# Patient Record
Sex: Male | Born: 2000
Health system: Southern US, Community
[De-identification: ages and names within clinical notes are randomized; demographics above are authoritative.]

## PROBLEM LIST (undated history)

## (undated) DIAGNOSIS — H6093 Unspecified otitis externa, bilateral: Secondary | ICD-10-CM

## (undated) DIAGNOSIS — S42309A Unspecified fracture of shaft of humerus, unspecified arm, initial encounter for closed fracture: Secondary | ICD-10-CM

## (undated) HISTORY — PX: TYMPANOSTOMY TUBE PLACEMENT: SHX32

## (undated) HISTORY — DX: Unspecified fracture of shaft of humerus, unspecified arm, initial encounter for closed fracture: S42.309A

## (undated) HISTORY — DX: Unspecified otitis externa, bilateral: H60.93

---
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
## 2019-01-30 ENCOUNTER — Telehealth: Payer: Self-pay | Admitting: Family Medicine

## 2019-01-30 NOTE — Telephone Encounter (Signed)
Please advise    Copied from CRM [id number]. Topic: General - Other >> Jan 30, 2019  3:31 PM Percival Spanish wrote:  Pt would also like to ger a CPE  Pt has a copy of his immunizations that his mom gave him and next to the meningitis 2019 it say invalid and he is asking why that would say that

## 2019-01-30 NOTE — Telephone Encounter (Signed)
Pt just had CPE 09/10/2018. Our records in Epic do not say anything and [facility] is not working at the moment for me to verify. I will keep trying and see if I can find anything in the system.

## 2019-01-31 ENCOUNTER — Telehealth: Payer: Self-pay | Admitting: Family Medicine

## 2019-01-31 NOTE — Telephone Encounter (Signed)
I have placed a immunization record form with a charge sheet in the bin up front.

## 2019-01-31 NOTE — Telephone Encounter (Signed)
Paperwork placed in PCP folder for signature.  

## 2019-01-31 NOTE — Telephone Encounter (Signed)
[facility] updated to show name brand of Bexsero. Pt guardian made aware. Also pt is going to health department next week to get 2nd vaccines.

## 2019-02-01 NOTE — Telephone Encounter (Signed)
Forms received.  Erica at front desk has taken them to call patient to let him know he can pick them up.

## 2019-02-01 NOTE — Telephone Encounter (Signed)
Form completed and placed in basket  

## 2019-02-06 DIAGNOSIS — Z23 Encounter for immunization: Secondary | ICD-10-CM | POA: Diagnosis not present

## 2019-03-12 ENCOUNTER — Ambulatory Visit: Payer: 59

## 2019-12-10 ENCOUNTER — Other Ambulatory Visit: Payer: Self-pay

## 2019-12-10 ENCOUNTER — Encounter: Payer: Self-pay | Admitting: Dermatology

## 2019-12-10 ENCOUNTER — Ambulatory Visit: Payer: 59 | Admitting: Dermatology

## 2019-12-10 DIAGNOSIS — L700 Acne vulgaris: Secondary | ICD-10-CM

## 2019-12-10 MED ORDER — ISOTRETINOIN 40 MG PO CAPS: 40.0000 mg | ORAL_CAPSULE | Freq: Every day | ORAL | 0 refills | Status: AC

## 2019-12-10 NOTE — Progress Notes (Signed)
   Follow-Up Visit   Subjective  Roberto Frazier is a 19 y.o. male who presents for the following: Acne (31 day isotretinoin f/u-see flowsheet).  acne Location: face>torso Duration: several years Quality: improved Associated Signs/Symptoms:deep bumps Modifying Factors: isotretinoin month four Severity:  Timing: Context:   The following portions of the chart were reviewed this encounter and updated as appropriate:     Objective  Well appearing patient in no apparent distress; mood and affect are within normal limits.  A focused examination was performed including face, head, neck. Relevant physical exam findings are noted in the Assessment and Plan.   Assessment & Plan  Acne vulgaris (3) Head - Anterior (Face)  Continue same dose. Discussed target dose of 150mg /kg rather than 120 but will decide in 2-3 months.

## 2020-01-14 ENCOUNTER — Ambulatory Visit: Payer: 59 | Admitting: Dermatology

## 2020-01-14 ENCOUNTER — Other Ambulatory Visit: Payer: Self-pay

## 2020-01-14 DIAGNOSIS — L700 Acne vulgaris: Secondary | ICD-10-CM

## 2020-01-14 DIAGNOSIS — L729 Follicular cyst of the skin and subcutaneous tissue, unspecified: Secondary | ICD-10-CM

## 2020-01-14 MED ORDER — MYORISAN 40 MG PO CAPS: 40.0000 mg | ORAL_CAPSULE | Freq: Every day | ORAL | 0 refills | Status: DC

## 2020-01-14 NOTE — Patient Instructions (Addendum)
Patient preferred to take a printed script to [facility].  Roberto Frazier acne is doing pretty much as expected his torso is virtually clear his face is about 75-85% improved with some persistent erythem;a he will continue the same dose of isotretinoin.  We also discussed elective removal of an epidermoid cyst on his left scapula.  He understands that this is a benign lesion that is removed if he chooses. he is actively weightlifting now with larger muscles than I have ever seen on him so removing the cyst would involve 2 weeks of no upper back weight activity to minimize the risk of the wound spreading.  I suggested he wait to do this until he is finished with the isotretinoin.

## 2020-01-20 ENCOUNTER — Encounter: Payer: Self-pay | Admitting: Dermatology

## 2020-01-20 NOTE — Progress Notes (Signed)
   Follow-Up Visit   Subjective  Roberto Frazier is a 19 y.o. male who presents for the following: Isotret follow-up (Here for 31 day isotretinoin follow-up.).  Acne Location: Face more than torso Duration: Several years Quality: Improved Associated Signs/Symptoms: Modifying Factors: Isotretinoin Severity:  Timing: Context:   The following portions of the chart were reviewed this encounter and updated as appropriate: Tobacco  Allergies  Meds  Problems  Med Hx  Surg Hx  Fam Hx      Objective  Well appearing patient in no apparent distress; mood and affect are within normal limits.  All skin waist up examined.   Assessment & Plan  Acne vulgaris Head - Anterior (Face)  Cyst of skin Left Upper Back  Patient may choose to schedule surgery after isotretinoin is complete

## 2020-02-17 ENCOUNTER — Telehealth: Payer: Self-pay

## 2020-02-17 ENCOUNTER — Ambulatory Visit: Payer: 59 | Admitting: Dermatology

## 2020-02-17 ENCOUNTER — Other Ambulatory Visit: Payer: Self-pay

## 2020-02-17 DIAGNOSIS — L700 Acne vulgaris: Secondary | ICD-10-CM | POA: Diagnosis not present

## 2020-02-17 MED ORDER — MYORISAN 40 MG PO CAPS: 40.0000 mg | ORAL_CAPSULE | Freq: Every day | ORAL | 0 refills | Status: DC

## 2020-02-17 NOTE — Telephone Encounter (Signed)
Phone call from patient stating he tried to get his Isotret prescription from last month filled and the Pharmacy wouldn't let him fill it.  Patient informed that he can't get the the prescription from last month filled he can only get his prescription from today filled.  Patient aware.

## 2020-02-24 ENCOUNTER — Encounter: Payer: Self-pay | Admitting: Dermatology

## 2020-02-24 NOTE — Progress Notes (Signed)
   Follow-Up Visit   Subjective  Roberto Frazier is a 19 y.o. male who presents for the following: Acne (isotretinoin follow up ). Acne  location: Face more than torso Duration:  Quality: Virtually clear Associated Signs/Symptoms: Modifying Factors: Oral isotretinoin Severity:  Timing: Context:   The following portions of the chart were reviewed this encounter and updated as appropriate: Tobacco  Allergies  Meds  Problems  Med Hx  Surg Hx  Fam Hx      Objective  Well appearing patient in no apparent distress; mood and affect are within normal limits.  A focused examination was performed including Head, neck, upper torso.. Relevant physical exam findings are noted in the Assessment and Plan.   Assessment & Plan  Acne vulgaris Head - Anterior (Face)  We will continue isotretinoin to reach [place] target of 120 mg/kg.  Sun protection.  MYORISAN 40 MG capsule - Head - Anterior (Face)

## 2020-02-27 ENCOUNTER — Telehealth: Payer: Self-pay | Admitting: Dermatology

## 2020-02-27 NOTE — Telephone Encounter (Signed)
ipledge approval is needed so he can get his RX

## 2020-02-27 NOTE — Telephone Encounter (Signed)
Called to let patient know I updated ipledge and he is qualified to received drug.

## 2020-03-19 ENCOUNTER — Ambulatory Visit: Payer: 59 | Admitting: Dermatology

## 2020-03-19 ENCOUNTER — Other Ambulatory Visit: Payer: Self-pay

## 2020-03-19 ENCOUNTER — Encounter: Payer: Self-pay | Admitting: Dermatology

## 2020-03-19 DIAGNOSIS — L700 Acne vulgaris: Secondary | ICD-10-CM | POA: Diagnosis not present

## 2020-03-19 MED ORDER — MYORISAN 40 MG PO CAPS: 40.0000 mg | ORAL_CAPSULE | Freq: Every day | ORAL | 0 refills | Status: DC

## 2020-03-19 NOTE — Progress Notes (Signed)
Patient had a lot of pills at home and let May prescription expire.

## 2020-03-22 ENCOUNTER — Encounter: Payer: Self-pay | Admitting: Dermatology

## 2020-03-22 NOTE — Progress Notes (Signed)
   Follow-Up Visit   Subjective  Roberto Frazier is a 19 y.o. male who presents for the following: Follow-up (31 days).  Acne Location: Face Duration:  Quality: Improved Associated Signs/Symptoms: Modifying Factors:  Severity:  Timing: Context:   Objective  Well appearing patient in no apparent distress; mood and affect are within normal limits.  A focused examination was performed including Head and neck.. Relevant physical exam findings are noted in the Assessment and Plan.   Assessment & Plan   There are no Patient Instructions on file for this visit.  Acne vulgaris Head - Anterior (Face)  Continue isotretinoin; will reach [place] target dose in 2 months.  Sun protection.  Reordered Medications MYORISAN 40 MG capsule     I, Janalyn Harder, MD, have reviewed all documentation for this visit.  The documentation on 03/22/20 for the exam, diagnosis, procedures, and orders are all accurate and complete.

## 2020-03-23 ENCOUNTER — Telehealth: Payer: Self-pay | Admitting: Dermatology

## 2020-03-23 NOTE — Telephone Encounter (Signed)
Phone call to patient to inform him that his prescription was sent to the Pharmacy on 03/19/2020.  Patient states that the Pharmacy told him they didn't have a prescription for him.  I informed patient that per the iPledge program his prescription was filled on 03/21/2020.  Patient states that he'll contact the Pharmacy again.

## 2020-03-23 NOTE — Telephone Encounter (Signed)
Patient says that he went to pick up prescription for accutane and [facility] on [address] told him that they had not received a prescription.  What can patient do to get his prescription?

## 2020-03-24 ENCOUNTER — Other Ambulatory Visit: Payer: Self-pay | Admitting: Dermatology

## 2020-03-24 NOTE — Telephone Encounter (Signed)
iPLEDGE HAS BEEN UPDATED PHARMACY MUST REFRESH THE COMPUTER AND FILL THE MEDICATION. MESSAGE LEFT FOR PATIENT

## 2020-03-24 NOTE — Telephone Encounter (Signed)
Patient left message on office voice mail saying that per pharmacy ([facility]) his ipedge needs to be updated by [facility] in order to get his prescription.  Patient would like call back once everything is done.

## 2020-04-20 ENCOUNTER — Encounter: Payer: Self-pay | Admitting: Physician Assistant

## 2020-04-20 ENCOUNTER — Other Ambulatory Visit: Payer: Self-pay

## 2020-04-20 ENCOUNTER — Ambulatory Visit: Payer: 59 | Admitting: Physician Assistant

## 2020-04-20 DIAGNOSIS — L700 Acne vulgaris: Secondary | ICD-10-CM | POA: Diagnosis not present

## 2020-04-20 MED ORDER — MYORISAN 40 MG PO CAPS: 40.0000 mg | ORAL_CAPSULE | Freq: Every day | ORAL | 0 refills | Status: AC

## 2020-04-20 NOTE — Progress Notes (Signed)
° °  Isotretinoin Follow-Up Visit   Subjective  Roberto Frazier is a 19 y.o. male who presents for the following: Acne (31 day Isotret). Here for last month of second course if isotretinoin. Has had a couple bumps that stayed for a few days. No headaches or joint aches.   The following portions of the chart were reviewed this encounter and updated as appropriate: Tobacco   Allergies   Meds   Problems   Med Hx   Surg Hx   Fam Hx        Isotretinoin F/U - 04/20/20 0900      Isotretinoin Follow Up   iPledge # [id number]    Date 04/20/20    Two Forms of Birth Control Abstinence    Acne breakouts since last visit? Yes      Dosage   Target Dosage (mg) 7950    Current (To Date) Dosage (mg) 7200    To Go Dosage (mg) 750      Side Effects   Skin Dry Lips    Gastrointestinal WNL    Neurological WNL    Constitutional WNL           Review of Systems: No other skin or systemic complaints.  Objective  Well appearing patient in no apparent distress; mood and affect are within normal limits.  Face examined. Relevant physical exam findings are noted in the Assessment and Plan.  Objective  Head - Anterior (Face): Acne is fairly clear. PIH noted cheeks.  Assessment & Plan  Acne vulgaris Head - Anterior (Face)  Reordered Medications MYORISAN 40 MG capsule

## 2020-05-21 ENCOUNTER — Ambulatory Visit: Payer: 59 | Admitting: Dermatology
